# Patient Record
Sex: Male | Born: 1965 | Race: White | Hispanic: No | Marital: Married | Smoking: Never smoker
Health system: Southern US, Community
[De-identification: ages and names within clinical notes are randomized; demographics above are authoritative.]

## PROBLEM LIST (undated history)

## (undated) DIAGNOSIS — IMO0002 Reserved for concepts with insufficient information to code with codable children: Secondary | ICD-10-CM

## (undated) DIAGNOSIS — M1990 Unspecified osteoarthritis, unspecified site: Secondary | ICD-10-CM

## (undated) HISTORY — PX: ROTATOR CUFF REPAIR: SHX139

## (undated) HISTORY — PX: KNEE SURGERY: SHX244

---

## 2002-07-01 ENCOUNTER — Inpatient Hospital Stay (HOSPITAL_COMMUNITY): Admission: EM | Admit: 2002-07-01 | Discharge: 2002-07-02 | Payer: Self-pay | Admitting: Emergency Medicine

## 2002-07-01 ENCOUNTER — Encounter: Payer: Self-pay | Admitting: Emergency Medicine

## 2002-07-03 ENCOUNTER — Encounter: Payer: Self-pay | Admitting: Emergency Medicine

## 2002-07-03 ENCOUNTER — Emergency Department (HOSPITAL_COMMUNITY): Admission: EM | Admit: 2002-07-03 | Discharge: 2002-07-03 | Payer: Self-pay | Admitting: Emergency Medicine

## 2003-08-08 ENCOUNTER — Ambulatory Visit (HOSPITAL_COMMUNITY): Admission: RE | Admit: 2003-08-08 | Discharge: 2003-08-08 | Payer: Self-pay | Admitting: Gastroenterology

## 2008-01-27 ENCOUNTER — Encounter: Admission: RE | Admit: 2008-01-27 | Discharge: 2008-01-27 | Payer: Self-pay

## 2011-02-28 NOTE — Discharge Summary (Signed)
NAMEGREELY, ATIYEH                         ACCOUNT NO.:  [id number]   MEDICAL RECORD NO.:  [id number]                   PATIENT TYPE:  INP   LOCATION:  5507                                 FACILITY:  [facility]   PHYSICIAN:  Talmage Coin, M.D.                  DATE OF BIRTH:  [birth year]   DATE OF ADMISSION:  DATE OF DISCHARGE:  07/02/2002                                 DISCHARGE SUMMARY   DISCHARGE DIAGNOSIS:  Community acquired pneumonia.   DISCHARGE MEDICATIONS:  Azithromycin 250 mg p.o. q.d. for five days, Vicodin  5/500 take 1-2 tablets p.o. q.4 hours p.r.n. for pain.   HISTORY/HOSPITAL COURSE:  Marvin Graves is a 45 year old gentleman who  recently returned from a trip to [place] from 09/12-09/16.  On 09/17 he had  some vague chest discomfort that felt burning like.  Tums did no help but  this pain eventually left.  When he woke up on 09/18 he felt a very  localized region of pain in his right chest with deep inspiration, about  golf-ball size.  He started feeling achy all over his body.  Took Tylenol  and found that it helped with the pain.  He took his temperature and found  it to be 104.  When he woke up on 09/19 he found that he could only take  about 80 percent of a full breath because of pain on inspiration.  He also  had a dry cough and chills.  He decided to come to the ER.  Because of his  recent travel he was put in respiratory isolation to rule out SARS.  He was  started on Rocephin and Azithromycin.  His hospital course was  uncomplicated.  He was discharged home in stable condition after remaining  afebrile throughout the remainder of his hospitalization, with his white  blood cells turning from 6.9 to 6.4.  It is felt that because his chest x-  ray was not consistent with the SARS picture and the last known cause of  SARS from [place] was 03/24/02 in the travel advisory lifted on 05/19/02.  It was felt that this was a community acquired pneumonia.  He was sent  home  on Azithromycin 250 mg p.o. times 5 days.  The patient is instructed to  return to his doctor if his cough worsens, if he has fevers over 101  degrees, or if he begins to cough up blood.   DISCHARGE LABORATORIES:  Hemoglobin 15.2, hematocrit 45.3, white blood cells  6.4, platelets 171,000, sodium 138, potassium 3.2, chloride 106, bicarb 27,  glucose 115, BUN 9, creatinine 1.1, AST 16, ALT 17, alk phos 43, total  bilirubin 2.2.  Of note a fractionated bilirubin was performed and the  patient was found to have indirect bilirubin of 1.8 with a normal range of 0-  1.0, it was felt that this could be worked up as an outpatient.  There has  not been many recent cases of SARS, the last known cause was in June and the  travel advisory was lifted in 08/03.  He is discharged home in stable  condition.     Oretha Ellis, M.D.                      Talmage Coin, M.D.    BT/MEDQ  D:  07/02/2002  T:  07/05/2002  Job:  [id number]   cc:   [facility]   Dr Felicity Coyer [facility]

## 2011-02-28 NOTE — Op Note (Signed)
   Marvin Graves, Marvin Graves                         ACCOUNT NO.:  [id number]   MEDICAL RECORD NO.:  [id number]                   PATIENT TYPE:  AMB   LOCATION:  ENDO                                 FACILITY:  [facility]   PHYSICIAN:  Graylin Shiver, M.D.                DATE OF BIRTH:  [birth year]   DATE OF PROCEDURE:  08/08/2003  DATE OF DISCHARGE:                                 OPERATIVE REPORT   PROCEDURE:  Esophagogastroduodenoscopy with biopsy for clo test.   INDICATIONS FOR PROCEDURE:  Persistent complaints of chest pain, rule out  upper GI abnormality. Informed consent was obtained after explanation of the  risks of  bleeding, infection and perforation.   PREMEDICATIONS:  Fentanyl 100 micrograms IV, Versed 10 mg IV.   DESCRIPTION OF PROCEDURE:  With the patient in the left lateral decubitus  position the Olympus gastroscope was inserted into the oropharynx and passed  into the esophagus. It was advanced  down the esophagus  and then into the  stomach  and into the duodenum. The 2nd portion involved with the duodenum  looked normal. The stomach  looked normal in its entirety. A biopsy for a  clo test was obtained. No lesions were seen in the body or fundus of the  stomach  on retroflexion.   The scope was straightened and brought into the esophagus. The  esophagogastric junction was at 42 cm. No obvious hiatal hernia was seen.  The esophageal  mucosa looked normal. He tolerated  the procedure well  without complications.   IMPRESSION:  Normal esophagogastroduodenoscopy. I see nothing specifically  to explain the patient's recurring chest pain. It may be secondary to  esophageal spasm.                                                Graylin Shiver, M.D.    Germain Osgood  D:  08/08/2003  T:  08/08/2003  Job:  [id number]   cc:   Windle Guard, M.D.  [address]  [place], [place] [address]  Fax: [phone]

## 2011-10-28 ENCOUNTER — Emergency Department (HOSPITAL_COMMUNITY)
Admission: EM | Admit: 2011-10-28 | Discharge: 2011-10-28 | Disposition: A | Discharge status: Home / Self Care | Payer: Managed Care, Other (non HMO) | Attending: Emergency Medicine | Admitting: Emergency Medicine

## 2011-10-28 ENCOUNTER — Encounter (HOSPITAL_COMMUNITY): Payer: Self-pay | Admitting: *Deleted

## 2011-10-28 DIAGNOSIS — R209 Unspecified disturbances of skin sensation: Secondary | ICD-10-CM | POA: Insufficient documentation

## 2011-10-28 DIAGNOSIS — M129 Arthropathy, unspecified: Secondary | ICD-10-CM | POA: Insufficient documentation

## 2011-10-28 HISTORY — DX: Unspecified osteoarthritis, unspecified site: M19.90

## 2011-10-28 HISTORY — DX: Reserved for concepts with insufficient information to code with codable children: IMO0002

## 2011-10-28 LAB — POCT I-STAT, CHEM 8
Calcium, Ion: 1.2 mmol/L (ref 1.12–1.32)
HCT: 48 % (ref 39.0–52.0)
Hemoglobin: 16.3 g/dL (ref 13.0–17.0)
Sodium: 142 mEq/L (ref 135–145)
TCO2: 25 mmol/L (ref 0–100)

## 2011-10-28 NOTE — ED Provider Notes (Signed)
Medical screening examination/treatment/procedure(s) were performed by non-physician practitioner and as supervising physician I was immediately available for consultation/collaboration.  Eliany Mccarter R Rima Blizzard, MD 10/28/11 2000 

## 2011-10-28 NOTE — ED Notes (Signed)
Pt reports having numbness in feet 3-4 weeks, has progressed up legs. Over last few days has noticed similar sensation in bil hands.

## 2011-10-28 NOTE — ED Provider Notes (Signed)
History     CSN: [id number]  Arrival date & time 10/28/11  1343   First MD Initiated Contact with Patient 10/28/11 1431      Chief Complaint  Patient presents with  . Numbness    (Consider location/radiation/quality/duration/timing/severity/associated sxs/prior treatment) The history is provided by the patient.  Pt is a 46yo healthy male, presenting with 3 week history of numbness and tingling to toes, feet, ankles, that is progressing gradually up his legs. States few days ago also developed tingling in hands now extending up the arms. Denies weakness of extremities. States thought at first it was related to his back, made appointment with [facility] in 3 days. States got concerned with numbness extended up his hands and arms. Denies any pain in his neck, or back. Denies any injuries. No hx of the same. Denies fever, chills, numbness of perineum, no loss of bowels or urinary incontinence/retention.  Past Medical History  Diagnosis Date  . Compressed vertebrae   . Arthritis     Past Surgical History  Procedure Date  . Rotator cuff repair   . Knee surgery     No family history on file.  History  Substance Use Topics  . Smoking status: Never Smoker   . Smokeless tobacco: Not on file  . Alcohol Use: Yes     occasionally      Review of Systems  Allergies  Review of patient's allergies indicates no known allergies.  Home Medications   Current Outpatient Rx  Name Route Sig Dispense Refill  . AMITRIPTYLINE HCL 10 MG PO TABS Oral Take 10 mg by mouth at bedtime as needed. sleep    . DM-GUAIFENESIN ER 30-600 MG PO TB12 Oral Take 1 tablet by mouth every 12 (twelve) hours.    . OMEGA-3 FATTY ACIDS 1000 MG PO CAPS Oral Take 2 g by mouth daily.    . IBUPROFEN 200 MG PO TABS Oral Take 200 mg by mouth every 6 (six) hours as needed. pain    . LORATADINE 10 MG PO TABS Oral Take 10 mg by mouth daily.      BP 144/86  Pulse 79  Temp(Src) 98.7 F (37.1 C) (Oral)  Resp 18  SpO2  100%  Physical Exam  Nursing note and vitals reviewed. Constitutional: He is oriented to person, place, and time. He appears well-developed.  HENT:  Head: Normocephalic and atraumatic.  Eyes: Conjunctivae are normal.  Neck: Normal range of motion. Neck supple.  Cardiovascular: Normal rate, regular rhythm and normal heart sounds.   Pulmonary/Chest: Effort normal and breath sounds normal. No respiratory distress.  Abdominal: Soft. Bowel sounds are normal. There is no tenderness.  Musculoskeletal: Normal range of motion. He exhibits no edema and no tenderness.       Full rom of bilateral lower and upper extremeties  Neurological: He is alert and oriented to person, place, and time.       Normal upper and lower extremity strength, equal bilaterally. 2+ patellar and achillis reflexes. Normal sensation over bilateral lower leg, thigh, hands, arms.   Skin: Skin is warm and dry. No pallor.  Psychiatric: He has a normal mood and affect.    ED Course  Procedures (including critical care time)  Will check electrolytes to r/o metabolic cause for pts symptoms. Pt has normal neurological exam today. Will d/c with neurology and/or nerosurgery follow up if negative.  Given symptoms that should prompt his return to ED   MDM  Lottie Mussel, PA 10/28/11 1600

## 2011-11-08 ENCOUNTER — Emergency Department (HOSPITAL_COMMUNITY): Payer: Managed Care, Other (non HMO)

## 2011-11-08 ENCOUNTER — Emergency Department (HOSPITAL_COMMUNITY)
Admission: EM | Admit: 2011-11-08 | Discharge: 2011-11-09 | Disposition: A | Discharge status: Home / Self Care | Payer: Managed Care, Other (non HMO) | Attending: Emergency Medicine | Admitting: Emergency Medicine

## 2011-11-08 DIAGNOSIS — Z79899 Other long term (current) drug therapy: Secondary | ICD-10-CM | POA: Insufficient documentation

## 2011-11-08 DIAGNOSIS — R209 Unspecified disturbances of skin sensation: Secondary | ICD-10-CM | POA: Insufficient documentation

## 2011-11-08 DIAGNOSIS — R51 Headache: Secondary | ICD-10-CM

## 2011-11-08 DIAGNOSIS — R197 Diarrhea, unspecified: Secondary | ICD-10-CM | POA: Insufficient documentation

## 2011-11-08 DIAGNOSIS — M129 Arthropathy, unspecified: Secondary | ICD-10-CM | POA: Insufficient documentation

## 2011-11-08 LAB — CBC
MCV: 86.8 fL (ref 78.0–100.0)
Platelets: 260 10*3/uL (ref 150–400)
RBC: 5.29 MIL/uL (ref 4.22–5.81)
RDW: 12.5 % (ref 11.5–15.5)
WBC: 9.8 10*3/uL (ref 4.0–10.5)

## 2011-11-08 LAB — POCT I-STAT, CHEM 8
BUN: 15 mg/dL (ref 6–23)
Hemoglobin: 17.3 g/dL — ABNORMAL HIGH (ref 13.0–17.0)
Potassium: 4.2 mEq/L (ref 3.5–5.1)
Sodium: 141 mEq/L (ref 135–145)
TCO2: 27 mmol/L (ref 0–100)

## 2011-11-08 LAB — DIFFERENTIAL
Basophils Absolute: 0 10*3/uL (ref 0.0–0.1)
Eosinophils Relative: 0 % (ref 0–5)
Lymphocytes Relative: 13 % (ref 12–46)
Neutrophils Relative %: 82 % — ABNORMAL HIGH (ref 43–77)

## 2011-11-08 MED ORDER — HYDROCODONE-ACETAMINOPHEN 5-325 MG PO TABS: 2.0000 | ORAL_TABLET | ORAL | Status: AC | PRN

## 2011-11-08 MED ORDER — SODIUM CHLORIDE 0.9 % IV BOLUS (SEPSIS)
1000.0000 mL | Freq: Once | INTRAVENOUS | Status: AC
  Administered 2011-11-08: 1000 mL via INTRAVENOUS

## 2011-11-08 MED ORDER — DIPHENHYDRAMINE HCL 50 MG/ML IJ SOLN
25.0000 mg | Freq: Once | INTRAMUSCULAR | Status: AC
  Administered 2011-11-08: 25 mg via INTRAVENOUS
  Filled 2011-11-08: qty 1

## 2011-11-08 MED ORDER — METOCLOPRAMIDE HCL 5 MG/ML IJ SOLN
10.0000 mg | Freq: Once | INTRAMUSCULAR | Status: AC
  Administered 2011-11-08: 10 mg via INTRAVENOUS
  Filled 2011-11-08: qty 2

## 2011-11-08 MED ORDER — DEXAMETHASONE SODIUM PHOSPHATE 10 MG/ML IJ SOLN
10.0000 mg | Freq: Once | INTRAMUSCULAR | Status: AC
  Administered 2011-11-08: 10 mg via INTRAVENOUS
  Filled 2011-11-08: qty 1

## 2011-11-08 NOTE — ED Provider Notes (Signed)
History     CSN: [id number]  Arrival date & time 11/08/11  2121   First MD Initiated Contact with Patient 11/08/11 2143      Chief Complaint  Patient presents with  . Migraine  . Nausea  . Diarrhea     HPI  History provided by the patient. Patient is a 46 year old male with past history of arthritis and compressed vertebrae presents with complaints of severe headache that began earlier today. Pain is located over the right forehead temporal and posterior head. Pain began gradually and slowly increased over the course of the day then subsided this evening before it returned it can with worsening pain over the last 2-3 hours. Pain is sharp and throbbing. Pain is made worse by bright lights and movements. Pain is associated with episodes of nausea and dry heaves without actual vomiting. Patient also reports having 2-3 episodes of loose diarrhea-like stools earlier in the day that "clean out his bowels". Patient also reports having numbness and tingling in the right side of his body but states this has been present for several weeks. He was seen and evaluated for similar symptoms one to 2 weeks ago in the emergency room with lab testing. Patient was also evaluated by neurology specialist yesterday for his numbness symptoms. Patient denies any changes in these symptoms. He denies any weakness in extremities. Patient denies any difficulty speaking, facial droop, confusion. Patient denies any recent fever, chills, sweats.     Past Medical History  Diagnosis Date  . Compressed vertebrae   . Arthritis     Past Surgical History  Procedure Date  . Rotator cuff repair   . Knee surgery     No family history on file.  History  Substance Use Topics  . Smoking status: Never Smoker   . Smokeless tobacco: Not on file  . Alcohol Use: Yes     occasionally      Review of Systems  Constitutional: Negative for fever and chills.  Eyes: Positive for photophobia.  Respiratory: Negative for  shortness of breath.   Cardiovascular: Negative for chest pain and palpitations.  Gastrointestinal: Positive for nausea. Negative for vomiting, abdominal pain, diarrhea and constipation.  Neurological: Positive for headaches. Negative for dizziness, weakness, light-headedness and numbness.    Allergies  Review of patient's allergies indicates no known allergies.  Home Medications   Current Outpatient Rx  Name Route Sig Dispense Refill  . AMITRIPTYLINE HCL 10 MG PO TABS Oral Take 10 mg by mouth at bedtime as needed. sleep    . DM-GUAIFENESIN ER 30-600 MG PO TB12 Oral Take 1 tablet by mouth every 12 (twelve) hours.    Marland Kitchen DICLOFENAC SODIUM 75 MG PO TBEC Oral Take 75 mg by mouth 2 (two) times daily.    . OMEGA-3 FATTY ACIDS 1000 MG PO CAPS Oral Take 2 g by mouth daily.    . IBUPROFEN 200 MG PO TABS Oral Take 200 mg by mouth every 6 (six) hours as needed. pain    . LORATADINE 10 MG PO TABS Oral Take 10 mg by mouth daily.      BP 157/89  Pulse 74  Temp(Src) 98.5 F (36.9 C) (Oral)  Resp 20  SpO2 100%  Physical Exam  Nursing note and vitals reviewed. Constitutional: He is oriented to person, place, and time. He appears well-developed and well-nourished. No distress.  HENT:  Head: Normocephalic.  Eyes: Conjunctivae and EOM are normal. Pupils are equal, round, and reactive to light.  No nystagmus  Neck: Normal range of motion. Neck supple. No tracheal deviation present.       No meningeal signs  Cardiovascular: Normal rate and regular rhythm.   No murmur heard. Pulmonary/Chest: Effort normal and breath sounds normal. No respiratory distress. He has no wheezes. He has no rales.  Abdominal: Soft. Bowel sounds are normal. There is no tenderness. There is no rebound.  Neurological: He is alert and oriented to person, place, and time. He has normal strength. No cranial nerve deficit. He displays a negative Romberg sign. Coordination and gait normal.       Patient reports slight  right-sided facial numbness. Patient with slightly decreased right patellar deep tendon reflex.    ED Course  Procedures    Labs Reviewed  CBC  DIFFERENTIAL  I-STAT, CHEM 8   Results for orders placed during the hospital encounter of 11/08/11  CBC      Component Value Range   WBC 9.8  4.0 - 10.5 (K/uL)   RBC 5.29  4.22 - 5.81 (MIL/uL)   Hemoglobin 16.7  13.0 - 17.0 (g/dL)   HCT 86.5  78.4 - 69.6 (%)   MCV 86.8  78.0 - 100.0 (fL)   MCH 31.6  26.0 - 34.0 (pg)   MCHC 36.4 (*) 30.0 - 36.0 (g/dL)   RDW 29.5  28.4 - 13.2 (%)   Platelets 260  150 - 400 (K/uL)  DIFFERENTIAL      Component Value Range   Neutrophils Relative 82 (*) 43 - 77 (%)   Neutro Abs 8.0 (*) 1.7 - 7.7 (K/uL)   Lymphocytes Relative 13  12 - 46 (%)   Lymphs Abs 1.3  0.7 - 4.0 (K/uL)   Monocytes Relative 5  3 - 12 (%)   Monocytes Absolute 0.5  0.1 - 1.0 (K/uL)   Eosinophils Relative 0  0 - 5 (%)   Eosinophils Absolute 0.0  0.0 - 0.7 (K/uL)   Basophils Relative 0  0 - 1 (%)   Basophils Absolute 0.0  0.0 - 0.1 (K/uL)  POCT I-STAT, CHEM 8      Component Value Range   Sodium 141  135 - 145 (mEq/L)   Potassium 4.2  3.5 - 5.1 (mEq/L)   Chloride 104  96 - 112 (mEq/L)   BUN 15  6 - 23 (mg/dL)   Creatinine, Ser 4.40  0.50 - 1.35 (mg/dL)   Glucose, Bld 102 (*) 70 - 99 (mg/dL)   Calcium, Ion 7.25  3.66 - 1.32 (mmol/L)   TCO2 27  0 - 100 (mmol/L)   Hemoglobin 17.3 (*) 13.0 - 17.0 (g/dL)   HCT 44.0  34.7 - 42.5 (%)     Ct Head Wo Contrast  11/08/2011  *RADIOLOGY REPORT*  Clinical Data:  Severe right posterior headache.  CT HEAD WITHOUT CONTRAST  Technique: Contiguous axial images were obtained from the base of the skull through the vertex without contrast  Comparison: None  Findings:  There is no evidence of intracranial hemorrhage, brain edema, or other signs of acute infarction.  There is no evidence of intracranial mass lesion or mass effect.  No abnormal extraaxial fluid collections are identified.  There is no  evidence of hydrocephalus, or other significant intracranial abnormality.  No skull abnormality identified.  IMPRESSION: Negative non-contrast head CT.  Original Report Authenticated By: Danae Orleans, M.D.     1. Headache       MDM  9:50 PM patient seen and evaluated. Patient in no  acute distress but appears uncomfortable.   10:30 PM patient reports that headache has improved significantly after medications. Patient has no other complaints at this time. CT scan is normal today. Lab work also unremarkable.  11:30 PM patient was seen and evaluated by attending physician. He agrees with workup and treatment plan. At this time will have patient return home and followup with PCP and neurology specialist.     Angus Seller, PA 11/08/11 2359

## 2011-11-08 NOTE — ED Notes (Signed)
Returned from CT.

## 2011-11-08 NOTE — ED Notes (Signed)
Dammen, PA at bedside.  

## 2011-11-08 NOTE — ED Notes (Addendum)
migraine - severe pain behind his right eyes/diarrhea x24 hr, cao per norm, right side of the face is painful, 10/10. Pt also reports taking diclofenac at home, however, noted that pills had expired on 11/18/2008

## 2011-11-08 NOTE — ED Notes (Signed)
Wickline, MD at bedside.  

## 2011-11-08 NOTE — ED Notes (Signed)
WUJ:[id number]<JY> Expected date:11/08/11<BR> Expected time: 9:04 PM<BR> Means of arrival:Ambulance<BR> Comments:<BR> EMS 100 GC - migraine - severe pain behind his eyes/diarrhea x24 hr, cao per norm

## 2011-11-08 NOTE — ED Notes (Signed)
Patient transported to CT 

## 2011-11-09 NOTE — ED Provider Notes (Signed)
Pt well appearing Reports headache gradual in onset, not sudden, unlikely SAH Well appearing,doubt acute neurologic process Has neuro f/u next month No gross motor deficits noted on my exam, he is awake/alert Stable for d/c  The patient appears reasonably screened and/or stabilized for discharge and I doubt any other medical condition or other [facility] requiring further screening, evaluation, or treatment in the ED at this time prior to discharge.   Joya Gaskins, MD 11/09/11 0030

## 2011-11-10 NOTE — ED Provider Notes (Signed)
Medical screening examination/treatment/procedure(s) were performed by non-physician practitioner and as supervising physician I was immediately available for consultation/collaboration.  Loren Racer, MD 11/10/11 [phone]

## 2017-10-27 ENCOUNTER — Institutional Professional Consult (permissible substitution): Payer: Self-pay | Admitting: Neurology

## 2018-06-23 ENCOUNTER — Other Ambulatory Visit: Payer: Self-pay | Admitting: Gastroenterology

## 2018-06-23 DIAGNOSIS — R109 Unspecified abdominal pain: Secondary | ICD-10-CM

## 2018-06-24 ENCOUNTER — Ambulatory Visit
Admission: RE | Admit: 2018-06-24 | Discharge: 2018-06-24 | Disposition: A | Discharge status: Home / Self Care | Payer: Managed Care, Other (non HMO) | Source: Ambulatory Visit | Attending: Gastroenterology | Admitting: Gastroenterology

## 2018-06-24 DIAGNOSIS — R109 Unspecified abdominal pain: Secondary | ICD-10-CM

## 2018-08-05 ENCOUNTER — Ambulatory Visit: Payer: Self-pay | Admitting: Allergy

## 2018-08-11 ENCOUNTER — Encounter: Payer: Self-pay | Admitting: Allergy

## 2018-08-11 ENCOUNTER — Ambulatory Visit: Payer: Managed Care, Other (non HMO) | Admitting: Allergy

## 2018-08-11 ENCOUNTER — Ambulatory Visit: Payer: Self-pay | Admitting: Allergy

## 2018-08-11 VITALS — BP 114/68 | HR 68 | Temp 98.4°F | Resp 16 | Ht 72.0 in | Wt 227.8 lb

## 2018-08-11 DIAGNOSIS — J3089 Other allergic rhinitis: Secondary | ICD-10-CM | POA: Diagnosis not present

## 2018-08-11 DIAGNOSIS — R21 Rash and other nonspecific skin eruption: Secondary | ICD-10-CM | POA: Diagnosis not present

## 2018-08-11 DIAGNOSIS — J310 Chronic rhinitis: Secondary | ICD-10-CM | POA: Insufficient documentation

## 2018-08-11 NOTE — Progress Notes (Addendum)
New Patient Note  RE: Marvin Graves MRN: [id number] DOB: 06-09-1966 Date of Office Visit: 08/11/2018  Referring provider: Kaleen Mask, * Primary care provider: Kaleen Mask, MD  Chief Complaint: Allergies  History of Present Illness: I had the pleasure of seeing Marvin Graves for initial evaluation at the [facility] on 08/11/2018. He is a 52 y.o. male, who is referred here by Kaleen Mask, MD for the evaluation of rash/pruritus and allergies.  Rash/pruritus: Had issues with pruritus and rash on the arms about 3-4 months ago which lasted for about 2 months. Denies any changes in diet, medications, personal care products. Used hydrocortisone cream and coconut oil which helped. Never had this issue before. There was concern about mold exposure which may have triggered this at work. Patient does work outdoors quite a lot.   Environmental allergies: He also reports symptoms of watery eyes with the rash.  Otherwise patient has scratchy throat, nasal congestion with his allergies. Symptoms have been going on for 20+ years. The symptoms are present during the fall and spring. He has used benadryl and loratadine with fair improvement in symptoms. Sinus infections: no. Previous work up includes: no.  Assessment and Plan: Marvin Graves is a 52 y.o. male with: Chronic rhinitis Minimal rhinitis symptoms for the past 20+ years mainly during the fall and spring.  Used Benadryl and loratadine in the past with some benefit.  Today skin testing showed: Negative to environmental allergies.   Will get bloodwork as below to rule out any additional allergens.   May use over the counter antihistamines such as Zyrtec (cetirizine), Claritin (loratadine), Allegra (fexofenadine), or Xyzal (levocetirizine) daily as needed.  Rash Broke out in an itchy rash on the upper extremities in the summer. No triggers noted. Symptoms resolved with some topical treatments over  a course of 2-3 months. Patient does work outdoors.   Discussed with patient that based on today's negative testing to environmental allergies, I don't have a clear explanation of what may have caused this.  Take pictures if this happens again and let me know.   Return if symptoms worsen or fail to improve.  Lab Orders     IgE+Allergens Zone 3(27)  Other allergy screening: Asthma: no Rhino conjunctivitis: yes Food allergy: no Medication allergy: no Hymenoptera allergy: no Urticaria: no Eczema:no History of recurrent infections suggestive of immunodeficency: no  Diagnostics: Skin Testing: Environmental allergy panel. Negative test to: Environmental allergy despite good histamine control.  Results discussed with patient/family. Airborne Adult Perc - 08/11/18 1004    Time Antigen Placed  1003    Allergen Manufacturer  Greer    Location  Back    Number of Test  59    Panel 1  Select    1. Control-Buffer 50% Glycerol  Negative    2. Control-Histamine 1 mg/ml  2+    3. Albumin saline  Negative    4. Bahia  Negative    5. French Southern Territories  Negative    6. Johnson  Negative    7. Kentucky Blue  Negative    8. Meadow Fescue  Negative    9. Perennial Rye  Negative    10. Sweet Vernal  Negative    11. Timothy  Negative    12. Cocklebur  Negative    13. Burweed Marshelder  Negative    14. Ragweed, short  Negative    15. Ragweed, Giant  Negative    16. Plantain,  English  Negative  17. Lamb's Quarters  Negative    18. Sheep Sorrell  Negative    19. Rough Pigweed  Negative    20. Marsh Elder, Rough  Negative    21. Mugwort, Common  Negative    22. Ash mix  Negative    23. Birch mix  Negative    24. Beech American  Negative    25. Box, Elder  Negative    26. Cedar, red  Negative    27. Cottonwood, Guinea-Bissau  Negative    28. Elm mix  Negative    29. Hickory mix  Negative    30. Maple mix  Negative    31. Oak, Guinea-Bissau mix  Negative    32. Pecan Pollen  Negative    33. Pine mix   Negative    34. Sycamore Eastern  Negative    35. Walnut, Black Pollen  Negative    36. Alternaria alternata  Negative    37. Cladosporium Herbarum  Negative    38. Aspergillus mix  Negative    39. Penicillium mix  Negative    40. Bipolaris sorokiniana (Helminthosporium)  Negative    41. Drechslera spicifera (Curvularia)  Negative    42. Mucor plumbeus  Negative    43. Fusarium moniliforme  Negative    44. Aureobasidium pullulans (pullulara)  Negative    45. Rhizopus oryzae  Negative    46. Botrytis cinera  Negative    47. Epicoccum nigrum  Negative    48. Phoma betae  Negative    49. Candida Albicans  Negative    50. Trichophyton mentagrophytes  Negative    51. Mite, D Farinae  5,000 AU/ml  Negative    52. Mite, D Pteronyssinus  5,000 AU/ml  Negative    53. Cat Hair 10,000 BAU/ml  Negative    54.  Dog Epithelia  Negative    55. Mixed Feathers  Negative    56. Horse Epithelia  Negative    57. Cockroach, German  Negative    58. Mouse  Negative    59. Tobacco Leaf  Negative       Past Medical History: Patient Active Problem List   Diagnosis Date Noted  . Chronic rhinitis 08/11/2018  . Rash 08/11/2018   Past Medical History:  Diagnosis Date  . Arthritis   . Compressed vertebrae    Past Surgical History: Past Surgical History:  Procedure Laterality Date  . KNEE SURGERY    . ROTATOR CUFF REPAIR     Medication List:  Current Outpatient Medications  Medication Sig Dispense Refill  . BELSOMRA 20 MG TABS Take 1 tablet by mouth at bedtime as needed. for sleep  0  . cyclobenzaprine (FLEXERIL) 10 MG tablet TAKE 1 2 (ONE HALF) TO 1 TABLET BY MOUTH AT BEDTIME FOR 3 10 DAYS AND THEN AS NEEDED  0  . fish oil-omega-3 fatty acids 1000 MG capsule Take 2 g by mouth daily.    Marland Kitchen ibuprofen (ADVIL,MOTRIN) 200 MG tablet Take 200 mg by mouth every 6 (six) hours as needed. pain    . loratadine (CLARITIN) 10 MG tablet Take 10 mg by mouth daily.     No current facility-administered  medications for this visit.    Allergies: No Known Allergies Social History: Social History   Socioeconomic History  . Marital status: Married    Spouse name: Not on file  . Number of children: Not on file  . Years of education: Not on file  . Highest education level:  Not on file  Occupational History  . Not on file  Social Needs  . Financial resource strain: Not on file  . Food insecurity:    Worry: Not on file    Inability: Not on file  . Transportation needs:    Medical: Not on file    Non-medical: Not on file  Tobacco Use  . Smoking status: Never Smoker  . Smokeless tobacco: Never Used  Substance and Sexual Activity  . Alcohol use: Yes    Comment: occasionally  . Drug use: No  . Sexual activity: Not on file  Lifestyle  . Physical activity:    Days per week: Not on file    Minutes per session: Not on file  . Stress: Not on file  Relationships  . Social connections:    Talks on phone: Not on file    Gets together: Not on file    Attends religious service: Not on file    Active member of club or organization: Not on file    Attends meetings of clubs or organizations: Not on file    Relationship status: Not on file  Other Topics Concern  . Not on file  Social History Narrative  . Not on file   Lives in a 53 year old home. Smoking: Denies Occupation: Brewing technologist History: Immunologist in the house: no Engineer, civil (consulting) in the family room: yes Carpet in the bedroom: yes Heating: electric Cooling: central Pet: yes 1 dog for 9 years and 1 cat for 5 years  Family History: Family History  Problem Relation Age of Onset  . Asthma Father   . Asthma Brother   . Asthma Daughter    Problem                               Relation Asthma                                   Father, brother, daughter Eczema                                No Food allergy                          No  Allergic rhino conjunctivitis     No   Review of  Systems  Constitutional: Negative for appetite change, chills, fever and unexpected weight change.  HENT: Positive for sore throat. Negative for congestion and rhinorrhea.   Eyes: Negative for itching.  Respiratory: Negative for cough, chest tightness, shortness of breath and wheezing.   Cardiovascular: Negative for chest pain.  Gastrointestinal: Negative for abdominal pain.  Genitourinary: Negative for difficulty urinating.  Skin: Negative for rash.  Allergic/Immunologic: Positive for environmental allergies. Negative for food allergies.  Neurological: Negative for headaches.   Objective: BP 114/68   Pulse 68   Temp 98.4 F (36.9 C)   Resp 16   Ht 6' (1.829 m)   Wt 227 lb 12.8 oz (103.3 kg)   BMI 30.90 kg/m  Body mass index is 30.9 kg/m. Physical Exam  Constitutional: He is oriented to person, place, and time. He appears well-developed and well-nourished.  HENT:  Head: Normocephalic and atraumatic.  Right Ear: External ear normal.  Left Ear: External ear normal.  Nose: Nose normal.  Mouth/Throat: Oropharynx is clear and moist.  Eyes: Conjunctivae and EOM are normal.  Neck: Neck supple.  Cardiovascular: Normal rate, regular rhythm and normal heart sounds. Exam reveals no gallop and no friction rub.  No murmur heard. Pulmonary/Chest: Effort normal and breath sounds normal. He has no wheezes. He has no rales.  Abdominal: Soft. Bowel sounds are normal. There is no tenderness.  Lymphadenopathy:    He has no cervical adenopathy.  Neurological: He is alert and oriented to person, place, and time.  Skin: Skin is warm. No rash noted.  Psychiatric: He has a normal mood and affect. His behavior is normal.  Nursing note and vitals reviewed.  The plan was reviewed with the patient/family, and all questions/concerned were addressed.  It was my pleasure to see Marvin Graves today and participate in his care. Please feel free to contact me with any questions or  concerns.  Sincerely,  Wyline Mood, DO Allergy & Immunology  [facility] office: [phone] [place] office:[phone]   Attestation: I was present as supervising physician for this patient encounter and agree with plan as outlined above by Dr. Thomasene Mohair, MD [facility]

## 2018-08-11 NOTE — Assessment & Plan Note (Addendum)
Minimal rhinitis symptoms for the past 20+ years mainly during the fall and spring.  Used Benadryl and loratadine in the past with some benefit.  Today skin testing showed: Negative to environmental allergies.   Will get bloodwork as below to rule out any additional allergens.   May use over the counter antihistamines such as Zyrtec (cetirizine), Claritin (loratadine), Allegra (fexofenadine), or Xyzal (levocetirizine) daily as needed.

## 2018-08-11 NOTE — Assessment & Plan Note (Addendum)
Broke out in an itchy rash on the upper extremities in the summer. No triggers noted. Symptoms resolved with some topical treatments over a course of 2-3 months. Patient does work outdoors.   Discussed with patient that based on today's negative testing to environmental allergies, I don't have a clear explanation of what may have caused this.  Take pictures if this happens again and let me know.

## 2018-08-11 NOTE — Patient Instructions (Addendum)
Chronic rhinitis Minimal rhinitis symptoms for the past 20+ years mainly during the fall and spring.  Used Benadryl and loratadine in the past with some benefit.  Today skin testing showed: Negative to environmental allergies.   Will get bloodwork as below to rule out any additional allergens.   May use over the counter antihistamines such as Zyrtec (cetirizine), Claritin (loratadine), Allegra (fexofenadine), or Xyzal (levocetirizine) daily as needed.  Rash Broke out in an itchy rash on the upper extremities in the summer. No triggers noted. Symptoms resolved with some topical treatments over a course of 2-3 months. Patient does work outdoors.   Discussed with patient that based on today's negative testing to environmental allergies, I don't have a clear explanation of what may have caused this.  Take pictures if this happens again and let me know.   Return if symptoms worsen or fail to improve.   Lab Orders     IgE+Allergens Zone 3(27)

## 2018-08-14 LAB — IGE+ALLERGENS ZONE 3(27)
Bahia Grass IgE: 0.18 kU/L — AB
Cat Dander IgE: <0.1 kU/L
Cockroach, American IgE: <0.1 kU/L
Common Silver Birch IgE: <0.1 kU/L
D Farinae IgE: <0.1 kU/L
D Pteronyssinus IgE: <0.1 kU/L
Dog Dander IgE: <0.1 kU/L
Elm, American IgE: 0.14 kU/L — AB
G002-IGE BERMUDA GRASS: 0.14 kU/L — AB
G008-IGE BLUEGRASS, KENTUCK: 0.21 kU/L — AB
G010-IGE JOHNSON GRASS: 0.18 kU/L — AB
Hickory, White IgE: <0.1 kU/L
IgE (Immunoglobulin E), Serum: 28 IU/mL (ref 6–495)
Maple/Box Elder IgE: 0.13 kU/L — AB
Nettle IgE: <0.1 kU/L
Oak, White IgE: 0.15 kU/L — AB
Penicillium Chrysogen IgE: <0.1 kU/L
Pigweed, Rough IgE: <0.1 kU/L
W001-IGE RAGWEED, SHORT: 0.15 kU/L — AB
W009-IGE PLANTAIN, ENGLISH: 0.15 kU/L — AB

## 2018-08-16 ENCOUNTER — Other Ambulatory Visit: Payer: Self-pay | Admitting: *Deleted

## 2018-11-02 ENCOUNTER — Ambulatory Visit
Admission: RE | Admit: 2018-11-02 | Discharge: 2018-11-02 | Disposition: A | Discharge status: Home / Self Care | Payer: Managed Care, Other (non HMO) | Source: Ambulatory Visit | Attending: Family Medicine | Admitting: Family Medicine

## 2018-11-02 ENCOUNTER — Other Ambulatory Visit: Payer: Self-pay | Admitting: Family Medicine

## 2018-11-02 DIAGNOSIS — R05 Cough: Secondary | ICD-10-CM

## 2018-11-02 DIAGNOSIS — R059 Cough, unspecified: Secondary | ICD-10-CM

## 2021-08-09 DIAGNOSIS — S93409A Sprain of unspecified ligament of unspecified ankle, initial encounter: Secondary | ICD-10-CM | POA: Insufficient documentation

## 2021-08-09 DIAGNOSIS — H61129 Hematoma of pinna, unspecified ear: Secondary | ICD-10-CM | POA: Insufficient documentation

## 2021-11-16 ENCOUNTER — Ambulatory Visit (INDEPENDENT_AMBULATORY_CARE_PROVIDER_SITE_OTHER): Payer: 59

## 2021-11-16 ENCOUNTER — Other Ambulatory Visit: Payer: Self-pay

## 2021-11-16 ENCOUNTER — Ambulatory Visit
Admission: EM | Admit: 2021-11-16 | Discharge: 2021-11-16 | Disposition: A | Discharge status: Home / Self Care | Payer: 59

## 2021-11-16 DIAGNOSIS — R0789 Other chest pain: Secondary | ICD-10-CM

## 2021-11-16 DIAGNOSIS — R059 Cough, unspecified: Secondary | ICD-10-CM | POA: Diagnosis not present

## 2021-11-16 MED ORDER — PREDNISONE 20 MG PO TABS: 40.0000 mg | ORAL_TABLET | Freq: Every day | ORAL | 0 refills | Status: AC

## 2021-11-16 NOTE — ED Triage Notes (Signed)
Since the beginning of the year Pt has had waxing and waning URI sxs. One week ago, Pt reports that his cough transitioned to a burning dry cough that hurt his chest. Two days ago, he notes a sudden onset of a " golf ball sized knot of pain" on the left side of his chest that only occurs when he tries to take a deep breath. Pain has decreased since the onset. No n/t or pain BUE.

## 2021-11-16 NOTE — ED Provider Notes (Signed)
[facility]    CSN: [id number] Arrival date & time: 11/16/21  0903      History   Chief Complaint Chief Complaint  Patient presents with   Pain with breathing    HPI Marvin Graves is a 56 y.o. male.   Patient here today for evaluation of left sided sternal chest pain that started two days ago. He reports pain was "golf ball sized" and has improved somewhat today. He notes that taking deep breaths worsens pain. He has recent history of waxing and waning URI symptoms with cough that started one week ago. He also notes that he has been starting to return to the [facility] recently after ankle injury, has been swimming and slowly returning to push ups, other activity. He has not had any new shortness of breath but reports some SHOB due to deconditioning.   The history is provided by the patient.   Past Medical History:  Diagnosis Date   Arthritis    Compressed vertebrae     Patient Active Problem List   Diagnosis Date Noted   Chronic rhinitis 08/11/2018   Rash 08/11/2018    Past Surgical History:  Procedure Laterality Date   KNEE SURGERY     ROTATOR CUFF REPAIR         Home Medications    Prior to Admission medications   Medication Sig Start Date End Date Taking? Authorizing Provider  predniSONE (DELTASONE) 20 MG tablet Take 2 tablets (40 mg total) by mouth daily with breakfast for 5 days. 11/16/21 11/21/21 Yes Francene Finders, PA-C  BELSOMRA 20 MG TABS Take 1 tablet by mouth at bedtime as needed. for sleep 07/27/18   [provider]  cyclobenzaprine (FLEXERIL) 10 MG tablet TAKE 1 2 (ONE HALF) TO 1 TABLET BY MOUTH AT BEDTIME FOR 3 10 DAYS AND THEN AS NEEDED 07/14/18   [provider]  fish oil-omega-3 fatty acids 1000 MG capsule Take 2 g by mouth daily.    [provider]  ibuprofen (ADVIL,MOTRIN) 200 MG tablet Take 200 mg by mouth every 6 (six) hours as needed. pain    [provider]  loratadine (CLARITIN) 10 MG tablet Take 10  mg by mouth daily.    [provider]  traZODone (DESYREL) 50 MG tablet Take 25-50 mg by mouth at bedtime. 11/07/21   [provider]  TRINTELLIX 10 MG TABS tablet Take 10 mg by mouth daily. 06/25/21   [provider]    Family History Family History  Problem Relation Age of Onset   Asthma Father    Asthma Brother    Asthma Daughter     Social History Social History   Tobacco Use   Smoking status: Never   Smokeless tobacco: Never  Substance Use Topics   Alcohol use: Yes    Comment: occasionally   Drug use: No     Allergies   Patient has no known allergies.   Review of Systems Review of Systems  Constitutional:  Negative for chills and fever.  HENT:  Negative for congestion and ear pain.   Eyes:  Negative for discharge and redness.  Respiratory:  Positive for cough and shortness of breath. Negative for wheezing.   Cardiovascular:  Positive for chest pain.  Gastrointestinal:  Negative for abdominal pain, nausea and vomiting.  Neurological:  Negative for numbness.    Physical Exam Triage Vital Signs ED Triage Vitals  Enc Vitals Group     BP 11/16/21 0918 122/83  Pulse Rate 11/16/21 0918 69     Resp 11/16/21 0918 18     Temp 11/16/21 0918 98.4 F (36.9 C)     Temp Source 11/16/21 0918 Oral     SpO2 11/16/21 0918 95 %     Weight --      Height --      Head Circumference --      Peak Flow --      Pain Score 11/16/21 0921 4     Pain Loc --      Pain Edu? --      Excl. in [place]? --    No data found.  Updated Vital Signs BP 122/83 (BP Location: Left Arm)    Pulse 69    Temp 98.4 F (36.9 C) (Oral)    Resp 18    SpO2 95%   Physical Exam Vitals and nursing note reviewed.  Constitutional:      General: He is not in acute distress.    Appearance: Normal appearance. He is not ill-appearing.  HENT:     Head: Normocephalic and atraumatic.  Eyes:     Conjunctiva/sclera: Conjunctivae normal.  Cardiovascular:     Rate and Rhythm:  Normal rate and regular rhythm.     Heart sounds: Normal heart sounds. No murmur heard. Pulmonary:     Effort: Pulmonary effort is normal. No respiratory distress.     Breath sounds: Normal breath sounds. No wheezing, rhonchi or rales.  Chest:     Chest wall: Tenderness (left lower sternum) present.  Neurological:     Mental Status: He is alert.  Psychiatric:        Mood and Affect: Mood normal.        Behavior: Behavior normal.        Thought Content: Thought content normal.     UC Treatments / Results  Labs (all labs ordered are listed, but only abnormal results are displayed) Labs Reviewed - No data to display  EKG   Radiology DG Chest 2 View  Result Date: 11/16/2021 CLINICAL DATA:  Cough EXAM: CHEST - 2 VIEW COMPARISON:  11/02/2018 FINDINGS: Cardiac size is within normal limits. Increase in AP diameter of chest suggests COPD. There are no signs of pulmonary edema or focal pulmonary consolidation. There is no pleural effusion or pneumothorax. IMPRESSION: COPD. There are no signs of pulmonary edema or focal pulmonary consolidation. Electronically Signed   By: Elmer Picker M.D.   On: 11/16/2021 09:46    Procedures Procedures (including critical care time)  Medications Ordered in UC Medications - No data to display  Initial Impression / Assessment and Plan / UC Course  I have reviewed the triage vital signs and the nursing notes.  Pertinent labs & imaging results that were available during my care of the patient were reviewed by me and considered in my medical decision making (see chart for details).    CXR without concerning findings-- did briefly discuss report of "COPD" but patient denies history of same. EKG with NSR. Differential includes muscular strain, costochondritis, bronchitis and I discussed these with patient. Reassured patient that I have low suspicion for cardiac etiology given reproducibility. Ultimately I suspect steroid burst will be helpful in  decreasing symptoms and encouraged him to be careful with increasing activity at gym. Encouraged follow up with any further concerns.   Final Clinical Impressions(s) / UC Diagnoses   Final diagnoses:  Other chest pain   Discharge Instructions   None    ED Prescriptions  Medication Sig Dispense Auth. Provider   predniSONE (DELTASONE) 20 MG tablet Take 2 tablets (40 mg total) by mouth daily with breakfast for 5 days. 10 tablet Francene Finders, PA-C      PDMP not reviewed this encounter.   Francene Finders, PA-C 11/16/21 1051

## 2022-06-14 ENCOUNTER — Encounter: Payer: Self-pay | Admitting: Emergency Medicine

## 2022-06-14 ENCOUNTER — Ambulatory Visit (INDEPENDENT_AMBULATORY_CARE_PROVIDER_SITE_OTHER): Payer: Managed Care, Other (non HMO)

## 2022-06-14 ENCOUNTER — Ambulatory Visit
Admission: EM | Admit: 2022-06-14 | Discharge: 2022-06-14 | Disposition: A | Discharge status: Home / Self Care | Payer: Managed Care, Other (non HMO) | Attending: Physician Assistant | Admitting: Physician Assistant

## 2022-06-14 DIAGNOSIS — S61212A Laceration without foreign body of right middle finger without damage to nail, initial encounter: Secondary | ICD-10-CM

## 2022-06-14 DIAGNOSIS — M79644 Pain in right finger(s): Secondary | ICD-10-CM | POA: Diagnosis not present

## 2022-06-14 NOTE — ED Triage Notes (Signed)
Pt was splitting wood and caught his right middle finger in the splitter and the tip of the finger was sliced. The finger has a flap.No bone showing

## 2022-06-14 NOTE — Discharge Instructions (Signed)
  Keep wound clean and dry- bandaged with antibiotic ointment.   Follow up in 7 days for suture removal or sooner with any concerns or signs of infection.

## 2022-06-14 NOTE — ED Provider Notes (Signed)
[facility]    CSN: [id number] Arrival date & time: 06/14/22  1032      History   Chief Complaint Chief Complaint  Patient presents with   Finger Injury    HPI Marvin Graves is a 56 y.o. male.   Patient here today for evaluation of injury to his right middle finger that occurred about 10:15 AM this morning. He reports that he was splitting wood and had his hand on top of a piece of wood that was already split somewhat and his right middle finger tip became entrapped inside the split and suspects the pressure created current wound. He was wearing gloves at the time of accident. He has had mild bleeding since the accident. He denies any numbness. He is up to date with tetanus vaccination.   The history is provided by the patient.    Past Medical History:  Diagnosis Date   Arthritis    Compressed vertebrae     Patient Active Problem List   Diagnosis Date Noted   Chronic rhinitis 08/11/2018   Rash 08/11/2018    Past Surgical History:  Procedure Laterality Date   KNEE SURGERY     ROTATOR CUFF REPAIR         Home Medications    Prior to Admission medications   Medication Sig Start Date End Date Taking? Authorizing Provider  BELSOMRA 20 MG TABS Take 1 tablet by mouth at bedtime as needed. for sleep 07/27/18   [provider]  cyclobenzaprine (FLEXERIL) 10 MG tablet TAKE 1 2 (ONE HALF) TO 1 TABLET BY MOUTH AT BEDTIME FOR 3 10 DAYS AND THEN AS NEEDED 07/14/18   [provider]  fish oil-omega-3 fatty acids 1000 MG capsule Take 2 g by mouth daily.    [provider]  ibuprofen (ADVIL,MOTRIN) 200 MG tablet Take 200 mg by mouth every 6 (six) hours as needed. pain    [provider]  loratadine (CLARITIN) 10 MG tablet Take 10 mg by mouth daily.    [provider]  traZODone (DESYREL) 50 MG tablet Take 25-50 mg by mouth at bedtime. 11/07/21   [provider]  TRINTELLIX 10 MG TABS tablet Take 10 mg by mouth  daily. 06/25/21   [provider]    Family History Family History  Problem Relation Age of Onset   Asthma Father    Asthma Brother    Asthma Daughter     Social History Social History   Tobacco Use   Smoking status: Never   Smokeless tobacco: Never  Substance Use Topics   Alcohol use: Yes    Comment: occasionally   Drug use: No     Allergies   Patient has no known allergies.   Review of Systems Review of Systems  Constitutional:  Negative for chills and fever.  Eyes:  Negative for discharge and redness.  Respiratory:  Negative for shortness of breath.   Skin:  Positive for wound. Negative for color change.  Neurological:  Negative for numbness.     Physical Exam Triage Vital Signs ED Triage Vitals  Enc Vitals Group     BP 06/14/22 1117 (!) 147/79     Pulse Rate 06/14/22 1117 78     Resp 06/14/22 1117 16     Temp 06/14/22 1117 98.7 F (37.1 C)     Temp Source 06/14/22 1117 Oral     SpO2 06/14/22 1117 95 %     Weight --      Height --  Head Circumference --      Peak Flow --      Pain Score 06/14/22 1116 10     Pain Loc --      Pain Edu? --      Excl. in GC? --    No data found.  Updated Vital Signs BP (!) 147/79 (BP Location: Left Arm)   Pulse 78   Temp 98.7 F (37.1 C) (Oral)   Resp 16   SpO2 95%      Physical Exam Vitals and nursing note reviewed.  Constitutional:      General: He is not in acute distress.    Appearance: Normal appearance. He is not ill-appearing.  HENT:     Head: Normocephalic and atraumatic.  Eyes:     Conjunctiva/sclera: Conjunctivae normal.  Cardiovascular:     Rate and Rhythm: Normal rate.  Pulmonary:     Effort: Pulmonary effort is normal.  Skin:    Capillary Refill: Normal cap refill to right middle finger    Comments: Approx 1.5 cm avulsion laceration to volar surface of right middle finger distally, no FB appreciated, minimal bleeding noted initially on exam- no bleeding after sutures completed   Neurological:     Mental Status: He is alert.     Comments: Gross sensation intact to right distal  middle finger  Psychiatric:        Mood and Affect: Mood normal.        Behavior: Behavior normal.        Thought Content: Thought content normal.      UC Treatments / Results  Labs (all labs ordered are listed, but only abnormal results are displayed) Labs Reviewed - No data to display  EKG   Radiology DG Finger Middle Right  Result Date: 06/14/2022 CLINICAL DATA:  Right long finger injury by wood splitter. EXAM: RIGHT MIDDLE FINGER 2+V COMPARISON:  None Available. FINDINGS: There is evidence of soft tissue injury at the distal aspect of the long finger. No acute fracture, dislocation, or radiopaque foreign body is identified. Minimal marginal spurring is noted at the DIP joint. IMPRESSION: No acute fracture or radiopaque foreign body. Electronically Signed   By: Sebastian Ache M.D.   On: 06/14/2022 11:37    Procedures Laceration Repair  Date/Time: 06/14/2022 1:23 PM  Performed by: Tomi Bamberger, PA-C Authorized by: Tomi Bamberger, PA-C   Consent:    Consent obtained:  Verbal   Consent given by:  Patient   Risks, benefits, and alternatives were discussed: yes     Risks discussed:  Pain   Alternatives discussed:  No treatment Universal protocol:    Procedure explained and questions answered to patient or proxy's satisfaction: yes     Imaging studies available: yes     Required blood products, implants, devices, and special equipment available: yes     Patient identity confirmed:  Provided demographic data Anesthesia:    Anesthesia method:  Local infiltration   Local anesthetic:  Lidocaine 1% w/o epi (1.5 cc) Laceration details:    Location:  Finger   Finger location:  R long finger   Length (cm):  1.5 Pre-procedure details:    Preparation:  Imaging obtained to evaluate for foreign bodies Exploration:    Hemostasis achieved with:  Direct pressure   Imaging  obtained: x-ray     Imaging outcome: foreign body not noted     Contaminated: no   Treatment:    Area cleansed with:  Saline   Amount  of cleaning:  Standard   Irrigation solution:  Sterile saline   Irrigation method:  Tap   Visualized foreign bodies/material removed: no   Skin repair:    Repair method:  Sutures   Suture size:  4-0   Suture material:  Prolene   Suture technique:  Simple interrupted   Number of sutures:  5 Approximation:    Approximation:  Close Repair type:    Repair type:  Simple Post-procedure details:    Dressing:  Antibiotic ointment   Procedure completion:  Tolerated well, no immediate complications  (including critical care time)  Medications Ordered in UC Medications - No data to display  Initial Impression / Assessment and Plan / UC Course  I have reviewed the triage vital signs and the nursing notes.  Pertinent labs & imaging results that were available during my care of the patient were reviewed by me and considered in my medical decision making (see chart for details).    No fracture or FB noted on xray. Laceration repaired in office without complication. Recommended follow up with any further concerns, signs of infection, or bleeding-- otherwise return in one week for suture removal.   Final Clinical Impressions(s) / UC Diagnoses   Final diagnoses:  Laceration of right middle finger without foreign body without damage to nail, initial encounter     Discharge Instructions       Keep wound clean and dry- bandaged with antibiotic ointment.   Follow up in 7 days for suture removal or sooner with any concerns or signs of infection.      ED Prescriptions   None    PDMP not reviewed this encounter.   Tomi Bamberger, PA-C 06/14/22 1326

## 2022-12-15 IMAGING — DX DG CHEST 2V
2 series · 2 of 2 positions shown · non-contrast
Comparison: 11/02/2018

CLINICAL DATA: Cough

EXAM:
CHEST - 2 VIEW

[chest pa]
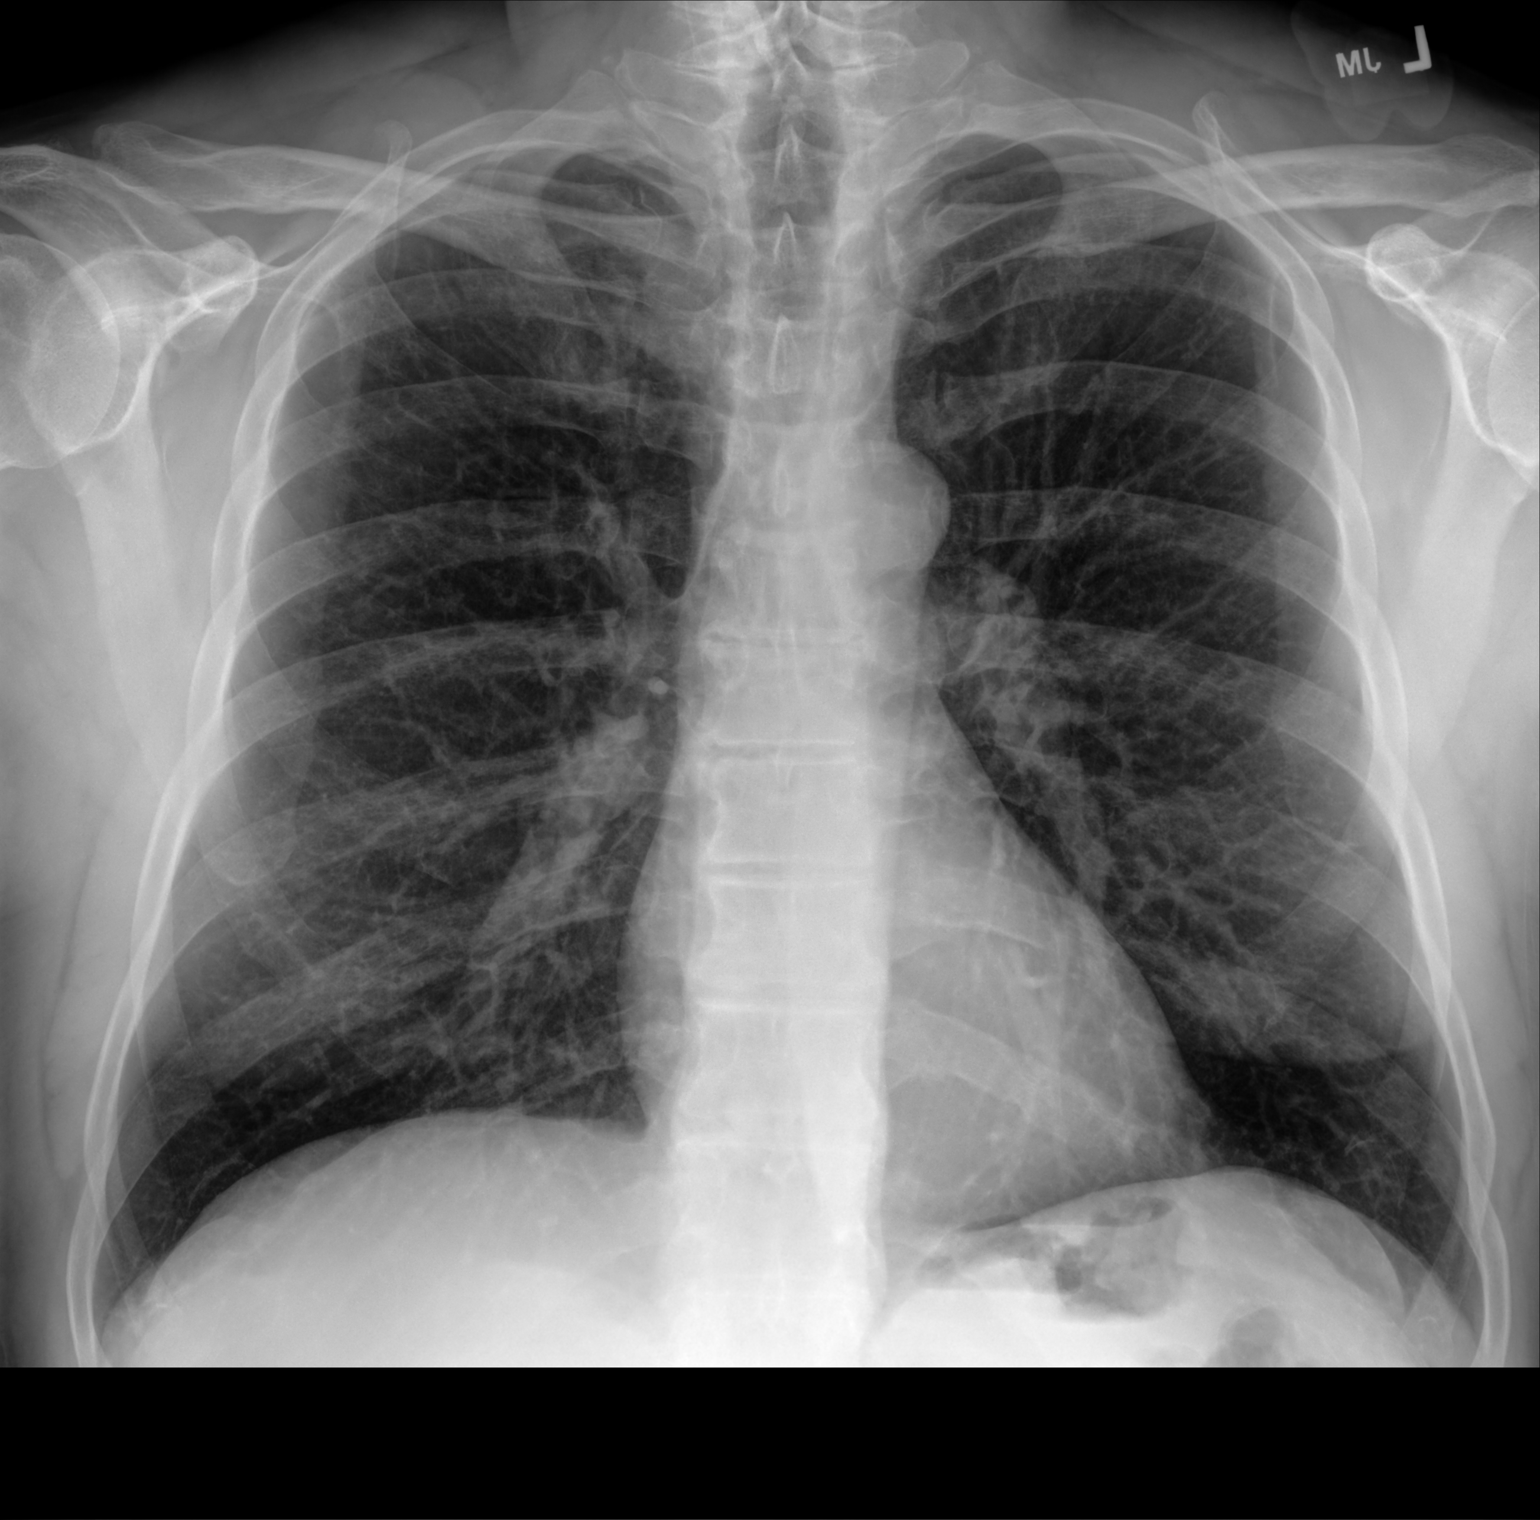

[chest lat]
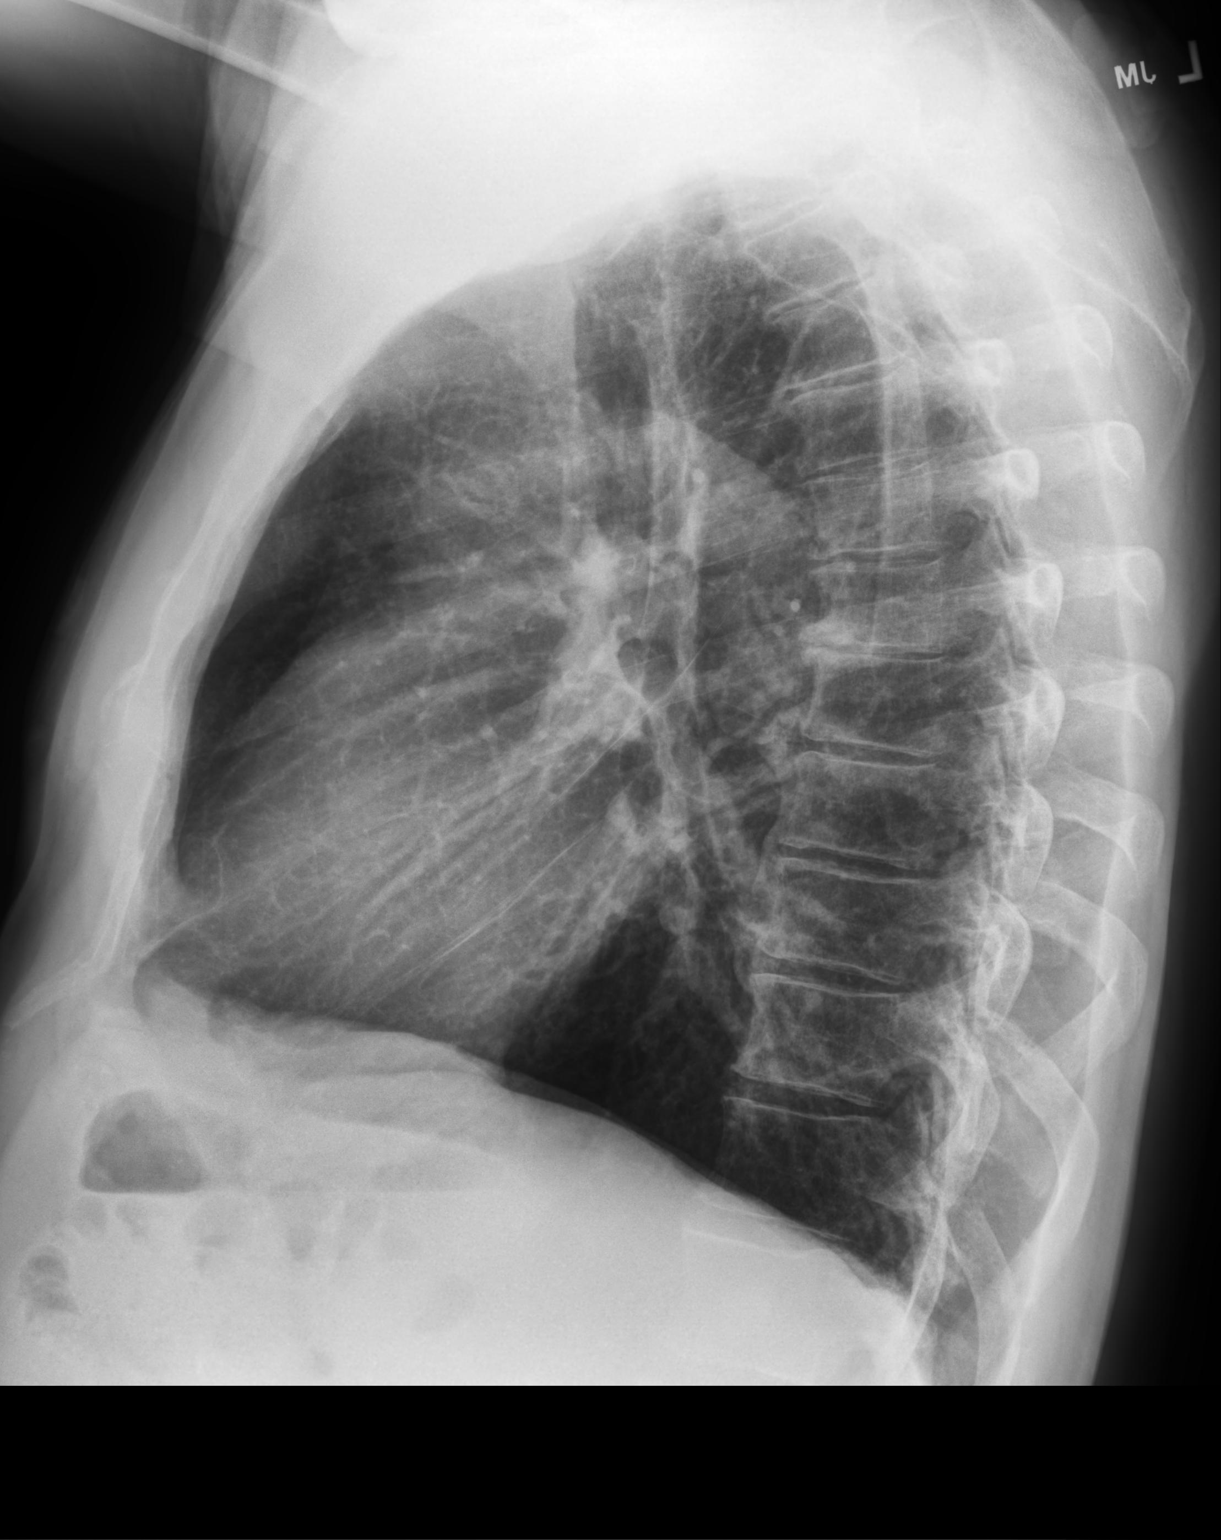

[2 of 2 positions shown; findings below may reference images not displayed]

FINDINGS: Cardiac size is within normal limits. Increase in AP diameter of
chest suggests COPD. There are no signs of pulmonary edema or focal
pulmonary consolidation. There is no pleural effusion or
pneumothorax.
IMPRESSION: COPD. There are no signs of pulmonary edema or focal pulmonary
consolidation.

## 2023-10-21 DIAGNOSIS — K219 Gastro-esophageal reflux disease without esophagitis: Secondary | ICD-10-CM | POA: Insufficient documentation

## 2024-06-16 ENCOUNTER — Inpatient Hospital Stay
Admission: RE | Admit: 2024-06-16 | Discharge: 2024-06-16 | Discharge status: Home / Self Care | Payer: Self-pay | Source: Ambulatory Visit

## 2024-06-16 ENCOUNTER — Ambulatory Visit

## 2024-06-16 ENCOUNTER — Ambulatory Visit
Admission: RE | Admit: 2024-06-16 | Discharge: 2024-06-16 | Disposition: A | Discharge status: Home / Self Care | Source: Ambulatory Visit

## 2024-06-16 DIAGNOSIS — R1013 Epigastric pain: Secondary | ICD-10-CM | POA: Insufficient documentation

## 2024-06-16 DIAGNOSIS — G8929 Other chronic pain: Secondary | ICD-10-CM

## 2024-06-16 DIAGNOSIS — M25571 Pain in right ankle and joints of right foot: Secondary | ICD-10-CM

## 2024-06-16 DIAGNOSIS — Z1211 Encounter for screening for malignant neoplasm of colon: Secondary | ICD-10-CM | POA: Insufficient documentation

## 2024-06-16 DIAGNOSIS — K219 Gastro-esophageal reflux disease without esophagitis: Secondary | ICD-10-CM | POA: Insufficient documentation

## 2024-06-16 MED ORDER — PREDNISONE 20 MG PO TABS: 40.0000 mg | ORAL_TABLET | Freq: Every day | ORAL | 0 refills | Status: AC

## 2024-06-16 NOTE — ED Provider Notes (Signed)
 [facility]    CSN: [id number] Arrival date & time: 06/16/24  1252      History   Chief Complaint Chief Complaint  Patient presents with   Ankle Pain    HPI Marvin Graves is a 58 y.o. male.   Patient here today for evaluation of her right ankle pain that has been ongoing since March.  He reports that he initially had pain in his Achilles area but this has now spread to his medial ankle and anterior lower leg.  He has not had any additional injuries that he is aware of.  He reports that walking for extended periods of time worsens symptoms.  He does have follow-up with Ortho scheduled in a couple weeks.  Has tried using Voltaren gel without resolution.  The history is provided by the patient.  Ankle Pain Associated symptoms: no fever     Past Medical History:  Diagnosis Date   Arthritis    Compressed vertebrae     Patient Active Problem List   Diagnosis Date Noted   Esophageal reflux 06/16/2024   Colon cancer screening 06/16/2024   Epigastric pain 06/16/2024   Gastroesophageal reflux disease 10/21/2023   Subchondral hematoma of pinna 08/09/2021   Sprain of lateral ligament of ankle joint 08/09/2021   Chronic rhinitis 08/11/2018   Rash 08/11/2018    Past Surgical History:  Procedure Laterality Date   KNEE SURGERY     ROTATOR CUFF REPAIR         Home Medications    Prior to Admission medications   Medication Sig Start Date End Date Taking? Authorizing Provider  cetirizine (ZYRTEC) 10 MG tablet Take 10 mg by mouth at bedtime. 04/28/20  Yes [provider]  Cholecalciferol 125 MCG (5000 UT) TABS Take 5,000 Units by mouth daily at 2 PM. 08/09/21  Yes [provider]  colestipol (COLESTID) 1 g tablet Take 1 g by mouth daily. 03/04/21  Yes [provider]  DAYVIGO 10 MG TABS Take 1 tablet by mouth at bedtime.   Yes [provider]  dextroamphetamine (DEXEDRINE SPANSULE) 10 MG 24 hr capsule Take 10 mg by mouth daily.  05/28/21  Yes [provider]  Ergocalciferol 10 MCG (400 UNIT) TABS Take by mouth. 11/03/19  Yes [provider]  famotidine (PEPCID) 20 MG tablet Take 20 mg by mouth daily. 06/23/18  Yes [provider]  fluticasone (FLONASE) 50 MCG/ACT nasal spray Place 2 sprays into both nostrils daily. 04/28/20  Yes [provider]  omeprazole (PRILOSEC) 20 MG capsule Take 20 mg by mouth daily. 04/28/20  Yes [provider]  polyethylene glycol-electrolytes (NULYTELY) 420 g solution Take 4,000 mLs by mouth See admin instructions. 03/19/21  Yes [provider]  predniSONE  (DELTASONE ) 20 MG tablet Take 2 tablets (40 mg total) by mouth daily with breakfast for 5 days. 06/16/24 06/21/24 Yes [name] FALCON, PA-C  traZODone (DESYREL) 50 MG tablet Take 25-50 mg by mouth at bedtime. 11/07/21  Yes [provider]  amitriptyline (ELAVIL) 10 MG tablet Take 10 mg by mouth at bedtime. 05/28/21   [provider]  BELSOMRA 20 MG TABS Take 1 tablet by mouth at bedtime as needed. for sleep 07/27/18   [provider]  cyclobenzaprine (FLEXERIL) 10 MG tablet TAKE 1 2 (ONE HALF) TO 1 TABLET BY MOUTH AT BEDTIME FOR 3 10 DAYS AND THEN AS NEEDED 07/14/18   [provider]  fish oil-omega-3 fatty acids 1000 MG capsule Take 2 g by  mouth daily.    [provider]  ibuprofen (ADVIL,MOTRIN) 200 MG tablet Take 200 mg by mouth every 6 (six) hours as needed. pain    [provider]  loratadine (CLARITIN) 10 MG tablet Take 10 mg by mouth daily.    [provider]  sildenafil (REVATIO) 20 MG tablet Take 20 mg by mouth as needed.    [provider]  tadalafil (CIALIS) 10 MG tablet SMARTSIG:0.5-2 Tablet(s) By Mouth    [provider]  TRINTELLIX 10 MG TABS tablet Take 10 mg by mouth daily. 06/25/21   [provider]    Family History Family History  Problem Relation Age of Onset   Asthma Father    Asthma  Brother    Asthma Daughter     Social History Social History   Tobacco Use   Smoking status: Never   Smokeless tobacco: Never  Vaping Use   Vaping status: Never Used  Substance Use Topics   Alcohol use: Yes    Comment: occasionally   Drug use: No     Allergies   Pollen extract   Review of Systems Review of Systems  Constitutional:  Negative for chills and fever.  Eyes:  Negative for discharge and redness.  Respiratory:  Negative for shortness of breath.   Musculoskeletal:  Positive for arthralgias.  Neurological:  Negative for numbness.     Physical Exam Triage Vital Signs ED Triage Vitals  Encounter Vitals Group     BP --      Girls Systolic BP Percentile --      Girls Diastolic BP Percentile --      Boys Systolic BP Percentile --      Boys Diastolic BP Percentile --      Pulse --      Resp --      Temp --      Temp src --      SpO2 --      Weight 06/16/24 1314 225 lb (102.1 kg)     Height 06/16/24 1314 6' (1.829 m)     Head Circumference --      Peak Flow --      Pain Score 06/16/24 1309 7     Pain Loc --      Pain Education --      Exclude from Growth Chart --    No data found.  Updated Vital Signs BP 137/84 (BP Location: Left Arm)   Pulse (!) 59   Temp 98.1 F (36.7 C) (Oral)   Resp 18   Ht 6' (1.829 m)   Wt 225 lb (102.1 kg)   SpO2 95%   BMI 30.52 kg/m   Visual Acuity Right Eye Distance:   Left Eye Distance:   Bilateral Distance:    Right Eye Near:   Left Eye Near:    Bilateral Near:     Physical Exam Vitals and nursing note reviewed.  Constitutional:      General: He is not in acute distress.    Appearance: Normal appearance. He is not ill-appearing.  HENT:     Head: Normocephalic and atraumatic.  Eyes:     Conjunctiva/sclera: Conjunctivae normal.  Cardiovascular:     Rate and Rhythm: Normal rate.  Pulmonary:     Effort: Pulmonary effort is normal. No respiratory distress.  Musculoskeletal:     Comments: Full range of  motion of right ankle.  No tenderness noted to right medial or lateral malleolus.  Skin:  Capillary Refill: Normal cap refill to right toes Neurological:     Mental Status: He is alert.     Comments: Patient reports decrease sensation to right toe distally, gross sensation intact to other toes on right foot  Psychiatric:        Mood and Affect: Mood normal.        Behavior: Behavior normal.        Thought Content: Thought content normal.      UC Treatments / Results  Labs (all labs ordered are listed, but only abnormal results are displayed) Labs Reviewed - No data to display  EKG   Radiology DG Ankle Complete Right Result Date: 06/16/2024 CLINICAL DATA:  Right ankle pain for several months. EXAM: RIGHT ANKLE - COMPLETE 3+ VIEW COMPARISON:  None Available. FINDINGS: There is no evidence of fracture, dislocation, or joint effusion. There is no evidence of arthropathy or other focal bone abnormality. Soft tissues are unremarkable. IMPRESSION: Negative. Electronically Signed   By: Lynwood Landy Raddle M.D.   On: 06/16/2024 13:52    Procedures Procedures (including critical care time)  Medications Ordered in UC Medications - No data to display  Initial Impression / Assessment and Plan / UC Course  I have reviewed the triage vital signs and the nursing notes.  Pertinent labs & imaging results that were available during my care of the patient were reviewed by me and considered in my medical decision making (see chart for details).    X-ray without acute findings.  Will trial steroid burst and recommended he keep appointment with Ortho as scheduled.  Encouraged follow-up sooner with any further concerns.  Final Clinical Impressions(s) / UC Diagnoses   Final diagnoses:  Chronic pain of right ankle   Discharge Instructions   None    ED Prescriptions     Medication Sig Dispense Auth. Provider   predniSONE  (DELTASONE ) 20 MG tablet Take 2 tablets (40 mg total) by mouth daily with  breakfast for 5 days. 10 tablet [name] FALCON, PA-C      PDMP not reviewed this encounter.   [name] FALCON, PA-C 06/16/24 1451

## 2024-06-16 NOTE — ED Triage Notes (Addendum)
 Patient reports a history of right Achilles pain that has been ongoing since starting pickleball in March, with recurrent right ankle pain. The patient notes current pain and swelling in the right ankle. An orthopedic appointment with a previously seen provider is scheduled for mid-September. No other interventions or treatments reported at this time. Patient has been using Voltaren Gel as needed.
